# Patient Record
Sex: Female | Born: 1970
Health system: Southern US, Community
[De-identification: ages and names within clinical notes are randomized; demographics above are authoritative.]

## PROBLEM LIST (undated history)

## (undated) DIAGNOSIS — I10 Essential (primary) hypertension: Secondary | ICD-10-CM

## (undated) HISTORY — DX: Essential (primary) hypertension: I10

---

## 2018-01-13 DIAGNOSIS — S9031XA Contusion of right foot, initial encounter: Secondary | ICD-10-CM | POA: Diagnosis not present

## 2018-05-13 ENCOUNTER — Ambulatory Visit: Payer: Self-pay | Admitting: Family Medicine

## 2018-06-28 ENCOUNTER — Ambulatory Visit: Payer: 59 | Admitting: Adult Health

## 2018-06-28 ENCOUNTER — Encounter: Payer: Self-pay | Admitting: Adult Health

## 2018-06-28 VITALS — BP 155/97 | HR 72 | Ht 61.25 in | Wt 157.5 lb

## 2018-06-28 DIAGNOSIS — I10 Essential (primary) hypertension: Secondary | ICD-10-CM

## 2018-06-28 DIAGNOSIS — Z0000 Encounter for general adult medical examination without abnormal findings: Secondary | ICD-10-CM

## 2018-06-28 DIAGNOSIS — N62 Hypertrophy of breast: Secondary | ICD-10-CM

## 2018-06-28 MED ORDER — CLONIDINE HCL 0.2 MG PO TABS
0.2000 mg | ORAL_TABLET | Freq: Once | ORAL | Status: AC
  Administered 2018-06-28: 0.2 mg via ORAL

## 2018-06-28 MED ORDER — AMLODIPINE BESYLATE 5 MG PO TABS: 5.0000 mg | ORAL_TABLET | Freq: Every day | ORAL | 3 refills | Status: DC

## 2018-06-28 NOTE — Assessment & Plan Note (Signed)
Referral to Plastic Surgery placed.

## 2018-06-28 NOTE — Assessment & Plan Note (Addendum)
BP 174/110 Recheck 182/114 Given clonidine 0.2mg  BP recheck 155/97, tolerated med well Initially dx'd with HTN at age 47 Has been of anti-hypertensive >1 year Re-started on Amlodipine 5mg  QD, first dose tomorrow night at bedtime Check BP daily, bring log to f/u in 2 weeks

## 2018-06-28 NOTE — Progress Notes (Signed)
Subjective:    Patient ID: Whitney Graham, female    DOB: April 10, 1971, 47 y.o.   MRN: [id number]  HPI:  Ms. Whitney Graham is here to establish as a new pt.  She is a pleasant 47 year old female. PMH: HTN, Anxiety, Pendulous Breasts that causes cervical neck/thoracic neck pain and poor posture. She was initially dx'd with HTN at age 29, off antihypertensive >1 year She denies acute cardiac sx's She has lost >12 lbs in last 3 weeks with reducing portion size and limiting CHO/sugar intake. She abstains from tobacco use and seldom consumes ETOH She remains active with yard work, "weighted Transport planner", and ellipitical. 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 She has not had regular medical care in years. She also has chronic anxiety that she manages with "being happy"   Patient Care Team    Relationship Specialty Notifications Start End  Julaine Fusi, NP PCP - General Family Medicine  06/28/18     Patient Active Problem List   Diagnosis Date Noted  . Hypertension 06/28/2018  . Large breasts 06/28/2018  . Healthcare maintenance 06/28/2018     Past Medical History:  Diagnosis Date  . Hypertension     Family History  Problem Relation Age of Onset  . Hypertension Father      Social History   Substance and Sexual Activity  Drug Use Never     Social History   Substance and Sexual Activity  Alcohol Use Never  . Frequency: Never     Social History   Tobacco Use  Smoking Status Never Smoker  Smokeless Tobacco Never Used     Outpatient Encounter Medications as of 06/28/2018  Medication Sig  . amLODipine (NORVASC) 5 MG tablet Take 1 tablet (5 mg total) by mouth daily.  . [EXPIRED] cloNIDine (CATAPRES) tablet 0.2 mg    No facility-administered encounter medications on file as of 06/28/2018.     Allergies: Patient has no allergy information on record.  Body mass index is 29.52 kg/m.  Blood pressure (!) 155/97, pulse 72, height 5' 1.25"  (1.556 m), weight 157 lb 8 oz (71.4 kg), last menstrual period 06/24/2018, SpO2 100 %.  Review of Systems  Constitutional: Positive for fatigue. Negative for activity change, appetite change, chills, diaphoresis, fever and unexpected weight change.  HENT: Negative for congestion.   Eyes: Negative for visual disturbance.  Respiratory: Negative for cough, chest tightness, shortness of breath, wheezing and stridor.   Cardiovascular: Negative for chest pain, palpitations and leg swelling.  Gastrointestinal: Negative for abdominal distention, abdominal pain, blood in stool, constipation, diarrhea, nausea and vomiting.  Endocrine: Negative for cold intolerance, heat intolerance, polydipsia, polyphagia and polyuria.  Genitourinary: Negative for difficulty urinating and dysuria.  Musculoskeletal: Positive for arthralgias, back pain, myalgias and neck stiffness.  Skin: Negative for color change, pallor, rash and wound.  Neurological: Negative for dizziness and headaches.  Hematological: Does not bruise/bleed easily.  Psychiatric/Behavioral: Negative for behavioral problems, confusion, decreased concentration, dysphoric mood, hallucinations, self-injury, sleep disturbance and suicidal ideas. The patient is nervous/anxious. The patient is not hyperactive.        Objective:   Physical Exam  Constitutional: She is oriented to person, place, and time. She appears well-developed and well-nourished. No distress.  HENT:  Head: Normocephalic and atraumatic.  Right Ear: External ear normal.  Left Ear: External ear normal.  Nose: Nose normal.  Mouth/Throat: Oropharynx is clear and moist.  Cardiovascular: Normal rate, regular rhythm, normal  heart sounds and intact distal pulses.  No murmur heard. Pulmonary/Chest: Effort normal and breath sounds normal. No stridor. No respiratory distress. She has no wheezes. She has no rales. She exhibits no tenderness.  Musculoskeletal: Normal range of motion. She  exhibits tenderness.       Cervical back: She exhibits tenderness.       Thoracic back: She exhibits tenderness.  Neurological: She is alert and oriented to person, place, and time.  Skin: Skin is warm and dry. Capillary refill takes less than 2 seconds. No rash noted. She is not diaphoretic. No erythema. No pallor.  Psychiatric: She has a normal mood and affect. Her behavior is normal. Judgment and thought content normal.      Assessment & Plan:   1. Healthcare maintenance   2. Large breasts   3. Essential hypertension     Hypertension BP 174/110 Recheck 182/114 Given clonidine 0.2mg  BP recheck 155/97, tolerated med well Initially dx'd with HTN at age 64 Has been of anti-hypertensive >1 year Re-started on Amlodipine 5mg  QD, first dose tomorrow night at bedtime Check BP daily, bring log to f/u in 2 weeks  Large breasts Referral to Plastic Surgery placed  Healthcare maintenance F/u 2 weeks- bring BP log and complete fasting labs Recommend complete physical this fall Mammogram order placed  Pt was in the office today for 45+ minutes, I spent >75% of time in face to face counseling of various medical concerns and in coordination of care  FOLLOW-UP:  Return in about 2 weeks (around 07/12/2018) for Regular Follow Up, HTN, Fasting Labs.

## 2018-06-28 NOTE — Patient Instructions (Addendum)
Mediterranean Diet A Mediterranean diet refers to food and lifestyle choices that are based on the traditions of countries located on the [place] Sea. This way of eating has been shown to help prevent certain conditions and improve outcomes for people who have chronic diseases, like kidney disease and heart disease. What are tips for following this plan? Lifestyle  Cook and eat meals together with your family, when possible.  Drink enough fluid to keep your urine clear or pale yellow.  Be physically active every day. This includes: ? Aerobic exercise like running or swimming. ? Leisure activities like gardening, walking, or housework.  Get 7-8 hours of sleep each night.  If recommended by your health care provider, drink red wine in moderation. This means 1 glass a day for nonpregnant women and 2 glasses a day for men. A glass of wine equals 5 oz (150 mL). Reading food labels  Check the serving size of packaged foods. For foods such as rice and pasta, the serving size refers to the amount of cooked product, not dry.  Check the total fat in packaged foods. Avoid foods that have saturated fat or trans fats.  Check the ingredients list for added sugars, such as corn syrup. Shopping  At the grocery store, buy most of your food from the areas near the walls of the store. This includes: ? Fresh fruits and vegetables (produce). ? Grains, beans, nuts, and seeds. Some of these may be available in unpackaged forms or large amounts (in bulk). ? Fresh seafood. ? Poultry and eggs. ? Low-fat dairy products.  Buy whole ingredients instead of prepackaged foods.  Buy fresh fruits and vegetables in-season from local farmers markets.  Buy frozen fruits and vegetables in resealable bags.  If you do not have access to quality fresh seafood, buy precooked frozen shrimp or canned fish, such as tuna, salmon, or sardines.  Buy small amounts of raw or cooked vegetables, salads, or olives from the  deli or salad bar at your store.  Stock your pantry so you always have certain foods on hand, such as olive oil, canned tuna, canned tomatoes, rice, pasta, and beans. Cooking  Cook foods with extra-virgin olive oil instead of using butter or other vegetable oils.  Have meat as a side dish, and have vegetables or grains as your main dish. This means having meat in small portions or adding small amounts of meat to foods like pasta or stew.  Use beans or vegetables instead of meat in common dishes like chili or lasagna.  Experiment with different cooking methods. Try roasting or broiling vegetables instead of steaming or sauteing them.  Add frozen vegetables to soups, stews, pasta, or rice.  Add nuts or seeds for added healthy fat at each meal. You can add these to yogurt, salads, or vegetable dishes.  Marinate fish or vegetables using olive oil, lemon juice, garlic, and fresh herbs. Meal planning  Plan to eat 1 vegetarian meal one day each week. Try to work up to 2 vegetarian meals, if possible.  Eat seafood 2 or more times a week.  Have healthy snacks readily available, such as: ? Vegetable sticks with hummus. ? Greek yogurt. ? Fruit and nut trail mix.  Eat balanced meals throughout the week. This includes: ? Fruit: 2-3 servings a day ? Vegetables: 4-5 servings a day ? Low-fat dairy: 2 servings a day ? Fish, poultry, or lean meat: 1 serving a day ? Beans and legumes: 2 or more servings a week ? Nuts   and seeds: 1-2 servings a day ? Whole grains: 6-8 servings a day ? Extra-virgin olive oil: 3-4 servings a day  Limit red meat and sweets to only a few servings a month What are my food choices?  Mediterranean diet ? Recommended ? Grains: Whole-grain pasta. Brown rice. Bulgar wheat. Polenta. Couscous. Whole-wheat bread. Orpah Cobb. ? Vegetables: Artichokes. Beets. Broccoli. Cabbage. Carrots. Eggplant. Green beans. Chard. Kale. Spinach. Onions. Leeks. Peas. Squash.  Tomatoes. Peppers. Radishes. ? Fruits: Apples. Apricots. Avocado. Berries. Bananas. Cherries. Dates. Figs. Grapes. Lemons. Melon. Oranges. Peaches. Plums. Pomegranate. ? Meats and other protein foods: Beans. Almonds. Sunflower seeds. Pine nuts. Peanuts. Cod. Salmon. Scallops. Shrimp. Tuna. Tilapia. Clams. Oysters. Eggs. ? Dairy: Low-fat milk. Cheese. Greek yogurt. ? Beverages: Water. Red wine. Herbal tea. ? Fats and oils: Extra virgin olive oil. Avocado oil. Grape seed oil. ? Sweets and desserts: [place] yogurt with honey. Baked apples. Poached pears. Trail mix. ? Seasoning and other foods: Basil. Cilantro. Coriander. Cumin. Mint. Parsley. Sage. Rosemary. Tarragon. Garlic. Oregano. Thyme. Pepper. Balsalmic vinegar. Tahini. Hummus. Tomato sauce. Olives. Mushrooms. ? Limit these ? Grains: Prepackaged pasta or rice dishes. Prepackaged cereal with added sugar. ? Vegetables: Deep fried potatoes (french fries). ? Fruits: Fruit canned in syrup. ? Meats and other protein foods: Beef. Pork. Lamb. Poultry with skin. Hot dogs. Tomasa Blase. ? Dairy: Ice cream. Sour cream. Whole milk. ? Beverages: Juice. Sugar-sweetened soft drinks. Beer. Liquor and spirits. ? Fats and oils: Butter. Canola oil. Vegetable oil. Beef fat (tallow). Lard. ? Sweets and desserts: Cookies. Cakes. Pies. Candy. ? Seasoning and other foods: Mayonnaise. Premade sauces and marinades. ? The items listed may not be a complete list. Talk with your dietitian about what dietary choices are right for you. Summary  The Mediterranean diet includes both food and lifestyle choices.  Eat a variety of fresh fruits and vegetables, beans, nuts, seeds, and whole grains.  Limit the amount of red meat and sweets that you eat.  Talk with your health care provider about whether it is safe for you to drink red wine in moderation. This means 1 glass a day for nonpregnant women and 2 glasses a day for men. A glass of wine equals 5 oz (150 mL). This information  is not intended to replace advice given to you by your health care provider. Make sure you discuss any questions you have with your health care provider. Document Released: 04/24/2016 Document Revised: 05/27/2016 Document Reviewed: 04/24/2016 Elsevier Interactive Patient Education  2018 [employer].  Increase water intake and follow Mediterranean Diet. Due to significantly elevated blood pressure, Clonidine 0.2mg  given in clinic Repeat BP 155/97 Re-started on Amlodipine 5mg , first dose tomorrow night at bedtime (06/29/18). Please check blood pressure daily and bring log to follow-up in two weeks. Also come fasting in 2 weeks. Referral to Plastic Surgery placed, re: Breast Reduction Mammogram order placed. WELCOME TO THE PRACTICE!

## 2018-06-28 NOTE — Assessment & Plan Note (Signed)
F/u 2 weeks- bring BP log and complete fasting labs Recommend complete physical this fall Mammogram order placed

## 2018-07-08 NOTE — Progress Notes (Signed)
Subjective:    Patient ID: Whitney Graham, female    DOB: 05-29-1971, 47 y.o.   MRN: [id number]  HPI: 06/28/18 OV:  Whitney Graham is here to establish as a new pt.  She is a pleasant 47 year old female. PMH: HTN, Anxiety, Pendulous Breasts that causes cervical neck/thoracic neck pain and poor posture. She was initially dx'd with HTN at age 6, off antihypertensive >1 year She denies acute cardiac sx's She has lost >12 lbs in last 3 weeks with reducing portion size and limiting CHO/sugar intake. She abstains from tobacco use and seldom consumes ETOH She remains active with yard work, "weighted Transport planner", and ellipitical. 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 She has not had regular medical care in years. She also has chronic anxiety that she manages with "being happy"  07/12/18 OV: Whitney Graham presents for f/u: HTN She was started on Amlodipine 5mg  QD Home BP  Readings: SBP 130-140 DBP 80-90 HR 70-80 She denies CP/dyspnea/HA/dizziness/Palpitations She has been following a heart healthy diet and unable to exercise due to her daughter being recently ill.  Patient Care Team    Relationship Specialty Notifications Start End  Julaine Fusi, NP PCP - General Family Medicine  06/28/18     Patient Active Problem List   Diagnosis Date Noted  . Hypertension 06/28/2018  . Large breasts 06/28/2018  . Healthcare maintenance 06/28/2018     Past Medical History:  Diagnosis Date  . Hypertension     Family History  Problem Relation Age of Onset  . Hypertension Father      Social History   Substance and Sexual Activity  Drug Use Never     Social History   Substance and Sexual Activity  Alcohol Use Never  . Frequency: Never     Social History   Tobacco Use  Smoking Status Never Smoker  Smokeless Tobacco Never Used     Outpatient Encounter Medications as of 07/12/2018  Medication Sig  . amLODipine (NORVASC) 5 MG tablet Take 1 tablet  (5 mg total) by mouth daily.   No facility-administered encounter medications on file as of 07/12/2018.     Allergies: Patient has no allergy information on record.  Body mass index is 29.09 kg/m.  Blood pressure 140/81, pulse 75, height 5' 1.25" (1.556 m), weight 155 lb 3.2 oz (70.4 kg), last menstrual period 06/24/2018, SpO2 100 %.  Review of Systems  Constitutional: Positive for fatigue. Negative for activity change, appetite change, chills, diaphoresis, fever and unexpected weight change.  HENT: Negative for congestion.   Eyes: Negative for visual disturbance.  Respiratory: Negative for cough, chest tightness, shortness of breath, wheezing and stridor.   Cardiovascular: Negative for chest pain, palpitations and leg swelling.  Gastrointestinal: Negative for abdominal distention, abdominal pain, blood in stool, constipation, diarrhea, nausea and vomiting.  Endocrine: Negative for cold intolerance, heat intolerance, polydipsia, polyphagia and polyuria.  Genitourinary: Negative for difficulty urinating and dysuria.  Musculoskeletal: Positive for arthralgias, back pain, myalgias and neck stiffness.  Skin: Negative for color change, pallor, rash and wound.  Neurological: Negative for dizziness and headaches.  Hematological: Does not bruise/bleed easily.  Psychiatric/Behavioral: Negative for behavioral problems, confusion, decreased concentration, dysphoric mood, hallucinations, self-injury, sleep disturbance and suicidal ideas. The patient is nervous/anxious. The patient is not hyperactive.        Objective:   Physical Exam  Constitutional: She is oriented to person, place, and time. She appears well-developed and well-nourished.  No distress.  HENT:  Head: Normocephalic and atraumatic.  Right Ear: External ear normal.  Left Ear: External ear normal.  Nose: Nose normal.  Mouth/Throat: Oropharynx is clear and moist.  Cardiovascular: Normal rate, regular rhythm, normal heart sounds  and intact distal pulses.  No murmur heard. Pulmonary/Chest: Effort normal and breath sounds normal. No stridor. No respiratory distress. She has no wheezes. She has no rales. She exhibits no tenderness.  Musculoskeletal: Normal range of motion. She exhibits tenderness.       Cervical back: She exhibits tenderness.       Thoracic back: She exhibits tenderness.  Neurological: She is alert and oriented to person, place, and time.  Skin: Skin is warm and dry. Capillary refill takes less than 2 seconds. No rash noted. She is not diaphoretic. No erythema. No pallor.  Psychiatric: She has a normal mood and affect. Her behavior is normal. Judgment and thought content normal.      Assessment & Plan:   1. Essential hypertension   2. Healthcare maintenance     Healthcare maintenance We will call you when lab results are available. Please schedule complete physical in 2 weeks.  Hypertension BP 140/81, HR 75 Continue Amlodipine 50mg  QD Continue Amlodipine 5mg  once daily. Continue to check blood pressure and heart once day. If readings are consistently <100/60 or >140/90 or you develop cardiac symptoms (ie dizziness/chest pain/palpitations) call clinic.   FOLLOW-UP:  Return in about 2 weeks (around 07/26/2018) for CPE.

## 2018-07-12 ENCOUNTER — Encounter: Payer: Self-pay | Admitting: Adult Health

## 2018-07-12 ENCOUNTER — Ambulatory Visit: Payer: 59 | Admitting: Adult Health

## 2018-07-12 VITALS — BP 140/81 | HR 75 | Ht 61.25 in | Wt 155.2 lb

## 2018-07-12 DIAGNOSIS — I10 Essential (primary) hypertension: Secondary | ICD-10-CM | POA: Diagnosis not present

## 2018-07-12 DIAGNOSIS — Z0000 Encounter for general adult medical examination without abnormal findings: Secondary | ICD-10-CM

## 2018-07-12 NOTE — Patient Instructions (Signed)

## 2018-07-12 NOTE — Assessment & Plan Note (Addendum)
We will call you when lab results are available. Please schedule complete physical in 2 weeks.

## 2018-07-12 NOTE — Assessment & Plan Note (Addendum)
BP 140/81, HR 75 Continue Amlodipine 50mg  QD Continue Amlodipine 5mg  once daily. Continue to check blood pressure and heart once day. If readings are consistently <100/60 or >140/90 or you develop cardiac symptoms (ie dizziness/chest pain/palpitations) call clinic.

## 2018-07-13 LAB — LIPID PANEL
CHOLESTEROL TOTAL: 199 mg/dL (ref 100–199)
Chol/HDL Ratio: 3.6 ratio (ref 0.0–4.4)
HDL: 56 mg/dL (ref >39)
LDL Calculated: 126 mg/dL — ABNORMAL HIGH (ref 0–99)
Triglycerides: 83 mg/dL (ref 0–149)
VLDL CHOLESTEROL CAL: 17 mg/dL (ref 5–40)

## 2018-07-13 LAB — CBC WITH DIFFERENTIAL/PLATELET
BASOS: 1 %
Basophils Absolute: 0 10*3/uL (ref 0.0–0.2)
EOS (ABSOLUTE): 0.2 10*3/uL (ref 0.0–0.4)
EOS: 3 %
HEMATOCRIT: 45.7 % (ref 34.0–46.6)
HEMOGLOBIN: 15 g/dL (ref 11.1–15.9)
Immature Grans (Abs): 0 10*3/uL (ref 0.0–0.1)
Immature Granulocytes: 0 %
LYMPHS ABS: 1.7 10*3/uL (ref 0.7–3.1)
Lymphs: 24 %
MCH: 26.4 pg — ABNORMAL LOW (ref 26.6–33.0)
MCHC: 32.8 g/dL (ref 31.5–35.7)
MCV: 81 fL (ref 79–97)
MONOCYTES: 9 %
MONOS ABS: 0.6 10*3/uL (ref 0.1–0.9)
Neutrophils Absolute: 4.4 10*3/uL (ref 1.4–7.0)
Neutrophils: 63 %
Platelets: 275 10*3/uL (ref 150–450)
RBC: 5.68 x10E6/uL — AB (ref 3.77–5.28)
RDW: 13.6 % (ref 12.3–15.4)
WBC: 6.9 10*3/uL (ref 3.4–10.8)

## 2018-07-13 LAB — COMPREHENSIVE METABOLIC PANEL
ALK PHOS: 57 IU/L (ref 39–117)
ALT: 19 IU/L (ref 0–32)
AST: 20 IU/L (ref 0–40)
Albumin/Globulin Ratio: 1.9 (ref 1.2–2.2)
Albumin: 4.5 g/dL (ref 3.5–5.5)
BUN / CREAT RATIO: 14 (ref 9–23)
BUN: 12 mg/dL (ref 6–24)
Bilirubin Total: 0.5 mg/dL (ref 0.0–1.2)
CALCIUM: 9.7 mg/dL (ref 8.7–10.2)
CO2: 23 mmol/L (ref 20–29)
CREATININE: 0.85 mg/dL (ref 0.57–1.00)
Chloride: 103 mmol/L (ref 96–106)
GFR, EST AFRICAN AMERICAN: 96 mL/min/{1.73_m2} (ref >59)
GFR, EST NON AFRICAN AMERICAN: 84 mL/min/{1.73_m2} (ref >59)
GLOBULIN, TOTAL: 2.4 g/dL (ref 1.5–4.5)
Glucose: 94 mg/dL (ref 65–99)
Potassium: 5 mmol/L (ref 3.5–5.2)
SODIUM: 139 mmol/L (ref 134–144)
Total Protein: 6.9 g/dL (ref 6.0–8.5)

## 2018-07-13 LAB — HEMOGLOBIN A1C
ESTIMATED AVERAGE GLUCOSE: 114 mg/dL
HEMOGLOBIN A1C: 5.6 % (ref 4.8–5.6)

## 2018-07-13 LAB — TSH: TSH: 2.46 u[IU]/mL (ref 0.450–4.500)

## 2018-07-14 ENCOUNTER — Telehealth: Payer: Self-pay | Admitting: Adult Health

## 2018-07-14 NOTE — Telephone Encounter (Signed)
Per patient received a call from [facility] regarding Lab results.  --Forward message call returned.

## 2018-07-14 NOTE — Telephone Encounter (Signed)
See documentation on result notes.  T. Kenetha Cozza, CMA 

## 2018-07-16 ENCOUNTER — Encounter: Payer: Self-pay | Admitting: Adult Health

## 2018-07-26 ENCOUNTER — Encounter: Payer: 59 | Admitting: Adult Health

## 2018-08-16 ENCOUNTER — Ambulatory Visit: Payer: 59

## 2018-08-17 ENCOUNTER — Ambulatory Visit: Payer: 59

## 2018-08-24 ENCOUNTER — Encounter: Payer: 59 | Admitting: Adult Health

## 2018-08-24 NOTE — Progress Notes (Deleted)
Subjective:    Patient ID: Whitney Graham, female    DOB: August 14, 1971, 47 y.o.   MRN: [id number]  HPI: 06/28/18 OV:  Whitney Graham is here to establish as a new pt.  She is a pleasant 47 year old female. PMH: HTN, Anxiety, Pendulous Breasts that causes cervical neck/thoracic neck pain and poor posture. She was initially dx'd with HTN at age 46, off antihypertensive >1 year She denies acute cardiac sx's She has lost >12 lbs in last 3 weeks with reducing portion size and limiting CHO/sugar intake. She abstains from tobacco use and seldom consumes ETOH She remains active with yard work, "weighted Transport planner", and ellipitical. 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 She has not had regular medical care in years. She also has chronic anxiety that she manages with "being happy"  07/12/18 OV: Whitney Graham presents for f/u: HTN She was started on Amlodipine 5mg  QD Home BP  Readings: SBP 130-140 DBP 80-90 HR 70-80 She denies CP/dyspnea/HA/dizziness/Palpitations She has been following a heart healthy diet and unable to exercise due to her daughter being recently ill.  08/24/18 OV: Whitney Graham is here for CPE  Healthcare Maintenance: PAP- Mammogram- Immunizations-  Patient Care Team    Relationship Specialty Notifications Start End  Julaine Fusi, NP PCP - General Family Medicine  06/28/18     Patient Active Problem List   Diagnosis Date Noted  . Hypertension 06/28/2018  . Large breasts 06/28/2018  . Healthcare maintenance 06/28/2018     Past Medical History:  Diagnosis Date  . Hypertension     Family History  Problem Relation Age of Onset  . Hypertension Father      Social History   Substance and Sexual Activity  Drug Use Never     Social History   Substance and Sexual Activity  Alcohol Use Never  . Frequency: Never     Social History   Tobacco Use  Smoking Status Never Smoker  Smokeless Tobacco Never Used     Outpatient  Encounter Medications as of 08/24/2018  Medication Sig  . amLODipine (NORVASC) 5 MG tablet Take 1 tablet (5 mg total) by mouth daily.   No facility-administered encounter medications on file as of 08/24/2018.     Allergies: Patient has no allergy information on record.  There is no height or weight on file to calculate BMI.  There were no vitals taken for this visit.  Review of Systems  Constitutional: Positive for fatigue. Negative for activity change, appetite change, chills, diaphoresis, fever and unexpected weight change.  HENT: Negative for congestion.   Eyes: Negative for visual disturbance.  Respiratory: Negative for cough, chest tightness, shortness of breath, wheezing and stridor.   Cardiovascular: Negative for chest pain, palpitations and leg swelling.  Gastrointestinal: Negative for abdominal distention, abdominal pain, blood in stool, constipation, diarrhea, nausea and vomiting.  Endocrine: Negative for cold intolerance, heat intolerance, polydipsia, polyphagia and polyuria.  Genitourinary: Negative for difficulty urinating and dysuria.  Musculoskeletal: Positive for arthralgias, back pain, myalgias and neck stiffness.  Skin: Negative for color change, pallor, rash and wound.  Neurological: Negative for dizziness and headaches.  Hematological: Does not bruise/bleed easily.  Psychiatric/Behavioral: Negative for behavioral problems, confusion, decreased concentration, dysphoric mood, hallucinations, self-injury, sleep disturbance and suicidal ideas. The patient is nervous/anxious. The patient is not hyperactive.        Objective:   Physical Exam  Constitutional: She is oriented to person, place, and time. She  appears well-developed and well-nourished. No distress.  HENT:  Head: Normocephalic and atraumatic.  Right Ear: External ear normal.  Left Ear: External ear normal.  Nose: Nose normal.  Mouth/Throat: Oropharynx is clear and moist.  Cardiovascular: Normal rate,  regular rhythm, normal heart sounds and intact distal pulses.  No murmur heard. Pulmonary/Chest: Effort normal and breath sounds normal. No stridor. No respiratory distress. She has no wheezes. She has no rales. She exhibits no tenderness.  Musculoskeletal: Normal range of motion. She exhibits tenderness.       Cervical back: She exhibits tenderness.       Thoracic back: She exhibits tenderness.  Neurological: She is alert and oriented to person, place, and time.  Skin: Skin is warm and dry. Capillary refill takes less than 2 seconds. No rash noted. She is not diaphoretic. No erythema. No pallor.  Psychiatric: She has a normal mood and affect. Her behavior is normal. Judgment and thought content normal.      Assessment & Plan:   No diagnosis found.  No problem-specific Assessment & Plan notes found for this encounter.  FOLLOW-UP:  No follow-ups on file.

## 2018-09-22 ENCOUNTER — Ambulatory Visit
Admission: RE | Admit: 2018-09-22 | Discharge: 2018-09-22 | Disposition: A | Discharge status: Home / Self Care | Payer: 59 | Source: Ambulatory Visit | Attending: Adult Health | Admitting: Adult Health

## 2018-09-22 DIAGNOSIS — Z0000 Encounter for general adult medical examination without abnormal findings: Secondary | ICD-10-CM

## 2018-09-22 DIAGNOSIS — Z1231 Encounter for screening mammogram for malignant neoplasm of breast: Secondary | ICD-10-CM | POA: Diagnosis not present

## 2018-09-28 ENCOUNTER — Encounter: Payer: 59 | Admitting: Adult Health

## 2019-07-18 ENCOUNTER — Other Ambulatory Visit: Payer: Self-pay | Admitting: Adult Health

## 2019-07-18 ENCOUNTER — Telehealth: Payer: Self-pay

## 2019-07-18 NOTE — Telephone Encounter (Signed)
Please call pt to schedule OV.  No further refills on meds until pt is seen.  Charyl Bigger, CMA

## 2019-07-23 ENCOUNTER — Encounter: Payer: Self-pay | Admitting: Adult Health

## 2019-07-25 ENCOUNTER — Other Ambulatory Visit: Payer: Self-pay

## 2019-08-14 ENCOUNTER — Other Ambulatory Visit: Payer: Self-pay | Admitting: Adult Health

## 2019-08-17 NOTE — Progress Notes (Signed)
Subjective:    Patient ID: Whitney Graham, female    DOB: 1971-08-28, 48 y.o.   MRN: [id number]  HPI:  06/28/18 OV:  [name] is here to establish as a new pt.  She is a pleasant 48 year old female. PMH: HTN, Anxiety, Pendulous Breasts that causes cervical neck/thoracic neck pain and poor posture. She was initially dx'd with HTN at age 23, off antihypertensive >1 year She denies acute cardiac sx's She has lost >12 lbs in last 3 weeks with reducing portion size and limiting CHO/sugar intake. She abstains from tobacco use and seldom consumes ETOH She remains active with yard work, "weighted Transport planner", and ellipitical. 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 She has not had regular medical care in years. She also has chronic anxiety that she manages with "being happy"  07/12/18 OV: [name] presents for f/u: HTN She was started on Amlodipine 5mg  QD  Home BP  Readings: SBP 130-140 DBP 80-90 HR 70-80 She denies CP/dyspnea/HA/dizziness/Palpitations She has been following a heart healthy diet and unable to exercise due to her daughter being recently ill.  08/18/2019 OV: [name] is here for f/u: HTN She was started on Amlodipine 5mg  06/2018- instructed to f/u 2 weeks This is her first OV since then. Per pt her ambulatory readings were reading high SBP 130-140s DBP upper 90s So she started taking her husband's Amlodipine 10mg  QD-  Diastolic readings reduced <80  She denies CP/dyspnea/HA/dizziness/Palpitations She has been trying to follow heart healthy diet, denies regular exercise.  She continues to abstain from tobacco/vape/excessive ETOH use  Fasting labs obtained today  Patient Care Team    Relationship Specialty Notifications Start End  , NP PCP - General Family Medicine  06/28/18     Patient Active Problem List   Diagnosis Date Noted  . Hypertension 06/28/2018  . Large breasts 06/28/2018  . Healthcare  maintenance 06/28/2018     Past Medical History:  Diagnosis Date  . Hypertension      Past Surgical History:  Procedure Laterality Date  . CESAREAN SECTION       Family History  Problem Relation Age of Onset  . Hypertension Father   . Breast cancer Maternal Grandmother   . Breast cancer Paternal Grandmother      Social History   Substance and Sexual Activity  Drug Use Never     Social History   Substance and Sexual Activity  Alcohol Use Never  . Frequency: Never     Social History   Tobacco Use  Smoking Status Never Smoker  Smokeless Tobacco Never Used     Outpatient Encounter Medications as of 08/18/2019  Medication Sig  . amLODipine (NORVASC) 10 MG tablet Take 1 tablet (10 mg total) by mouth daily. MUST HAVE OFFICE VISIT PRIOR TO ANY FURTHER REFILLS  . [DISCONTINUED] amLODipine (NORVASC) 5 MG tablet TAKE 1 TABLET (5 MG TOTAL) BY MOUTH DAILY. MUST HAVE OFFICE VISIT PRIOR TO ANY FURTHER REFILLS (Patient taking differently: Take 10 mg by mouth daily. MUST HAVE OFFICE VISIT PRIOR TO ANY FURTHER REFILLS)   No facility-administered encounter medications on file as of 08/18/2019.     Allergies: Patient has no allergy information on record.  Body mass index is 29.1 kg/m.  Blood pressure 136/89, pulse 94, temperature 98.8 F (37.1 C), temperature source Oral, height 5' 1.25" (1.556 m), weight 155 lb 4.8 oz (70.4 kg), last menstrual period 08/18/2019, SpO2 100 %.  Review of Systems  Constitutional: Positive for fatigue. Negative for activity change, appetite change, chills, diaphoresis, fever and unexpected weight change.  HENT: Negative for congestion.   Eyes: Negative for visual disturbance.  Respiratory: Negative for cough, chest tightness, shortness of breath, wheezing and stridor.   Cardiovascular: Negative for chest pain, palpitations and leg swelling.  Gastrointestinal: Negative for abdominal distention, abdominal pain, blood in stool,  constipation, diarrhea, nausea and vomiting.  Endocrine: Negative for polydipsia, polyphagia and polyuria.  Genitourinary: Negative for difficulty urinating and flank pain.  Musculoskeletal: Negative for arthralgias and gait problem.  Neurological: Negative for dizziness and headaches.  Hematological: Negative for adenopathy. Does not bruise/bleed easily.  Psychiatric/Behavioral: Negative for agitation, behavioral problems, confusion, decreased concentration, dysphoric mood, hallucinations, self-injury, sleep disturbance and suicidal ideas. The patient is not nervous/anxious and is not hyperactive.        Objective:   Physical Exam Vitals signs and nursing note reviewed.  Constitutional:      General: She is not in acute distress.    Appearance: Normal appearance. She is not ill-appearing, toxic-appearing or diaphoretic.  HENT:     Head: Normocephalic and atraumatic.  Cardiovascular:     Rate and Rhythm: Normal rate and regular rhythm.     Pulses: Normal pulses.     Heart sounds: Normal heart sounds. No murmur. No friction rub. No gallop.   Skin:    General: Skin is warm and dry.     Capillary Refill: Capillary refill takes less than 2 seconds.  Neurological:     Mental Status: She is alert and oriented to person, place, and time.  Psychiatric:        Mood and Affect: Mood normal.        Behavior: Behavior normal.        Thought Content: Thought content normal.        Judgment: Judgment normal.           Assessment & Plan:   1. Hypertension, unspecified type   2. Healthcare maintenance   3. Carpal tunnel syndrome of left wrist     Healthcare maintenance Amlodipine 10mg  once daily. Please check your blood pressure/heart rate once daily-record. We will contact you with your lab results. Follow-up in 2 weeks, TeleMedicine  Continue to social distance and wear a mask when in public.  Hypertension Per pt her ambulatory readings were reading high SBP 130-140s DBP  upper 90s So she started taking her husband's Amlodipine 10mg  QD-  Diastolic readings reduced <80 CCB refilled for 10mg  strength Check BP/HR daily- f/u 2 weeks via TeleMedicine     FOLLOW-UP:  Return in about 2 weeks (around 09/01/2019) for HTN, Evaluate Medication Effectiveness, TeleMedicine Follow-Up.

## 2019-08-18 ENCOUNTER — Ambulatory Visit (INDEPENDENT_AMBULATORY_CARE_PROVIDER_SITE_OTHER): Payer: 59 | Admitting: Adult Health

## 2019-08-18 ENCOUNTER — Encounter: Payer: Self-pay | Admitting: Adult Health

## 2019-08-18 ENCOUNTER — Other Ambulatory Visit: Payer: Self-pay

## 2019-08-18 VITALS — BP 136/89 | HR 94 | Temp 98.8°F | Ht 61.25 in | Wt 155.3 lb

## 2019-08-18 DIAGNOSIS — I10 Essential (primary) hypertension: Secondary | ICD-10-CM

## 2019-08-18 DIAGNOSIS — Z0000 Encounter for general adult medical examination without abnormal findings: Secondary | ICD-10-CM

## 2019-08-18 DIAGNOSIS — G5602 Carpal tunnel syndrome, left upper limb: Secondary | ICD-10-CM

## 2019-08-18 MED ORDER — AMLODIPINE BESYLATE 10 MG PO TABS: 10.0000 mg | ORAL_TABLET | Freq: Every day | ORAL | 0 refills | Status: DC

## 2019-08-18 NOTE — Patient Instructions (Addendum)
Managing Your Hypertension Hypertension is commonly called high blood pressure. This is when the force of your blood pressing against the walls of your arteries is too strong. Arteries are blood vessels that carry blood from your heart throughout your body. Hypertension forces the heart to work harder to pump blood, and may cause the arteries to become narrow or stiff. Having untreated or uncontrolled hypertension can cause heart attack, stroke, kidney disease, and other problems. What are blood pressure readings? A blood pressure reading consists of a higher number over a lower number. Ideally, your blood pressure should be below 120/80. The first ("top") number is called the systolic pressure. It is a measure of the pressure in your arteries as your heart beats. The second ("bottom") number is called the diastolic pressure. It is a measure of the pressure in your arteries as the heart relaxes. What does my blood pressure reading mean? Blood pressure is classified into four stages. Based on your blood pressure reading, your health care provider may use the following stages to determine what type of treatment you need, if any. Systolic pressure and diastolic pressure are measured in a unit called mm Hg. Normal  Systolic pressure: below 784.  Diastolic pressure: below 80. Elevated  Systolic pressure: 696-295.  Diastolic pressure: below 80. Hypertension stage 1  Systolic pressure: 284-132.  Diastolic pressure: 44-01. Hypertension stage 2  Systolic pressure: 027 or above.  Diastolic pressure: 90 or above. What health risks are associated with hypertension? Managing your hypertension is an important responsibility. Uncontrolled hypertension can lead to:  A heart attack.  A stroke.  A weakened blood vessel (aneurysm).  Heart failure.  Kidney damage.  Eye damage.  Metabolic syndrome.  Memory and concentration problems. What changes can I make to manage my hypertension?  Hypertension can be managed by making lifestyle changes and possibly by taking medicines. Your health care provider will help you make a plan to bring your blood pressure within a normal range. Eating and drinking   Eat a diet that is high in fiber and potassium, and low in salt (sodium), added sugar, and fat. An example eating plan is called the DASH (Dietary Approaches to Stop Hypertension) diet. To eat this way: ? Eat plenty of fresh fruits and vegetables. Try to fill half of your plate at each meal with fruits and vegetables. ? Eat whole grains, such as whole wheat pasta, brown rice, or whole grain bread. Fill about one quarter of your plate with whole grains. ? Eat low-fat diary products. ? Avoid fatty cuts of meat, processed or cured meats, and poultry with skin. Fill about one quarter of your plate with lean proteins such as fish, chicken without skin, beans, eggs, and tofu. ? Avoid premade and processed foods. These tend to be higher in sodium, added sugar, and fat.  Reduce your daily sodium intake. Most people with hypertension should eat less than 1,500 mg of sodium a day.  Limit alcohol intake to no more than 1 drink a day for nonpregnant women and 2 drinks a day for men. One drink equals 12 oz of beer, 5 oz of wine, or 1 oz of hard liquor. Lifestyle  Work with your health care provider to maintain a healthy body weight, or to lose weight. Ask what an ideal weight is for you.  Get at least 30 minutes of exercise that causes your heart to beat faster (aerobic exercise) most days of the week. Activities may include walking, swimming, or biking.  Include exercise  to strengthen your muscles (resistance exercise), such as weight lifting, as part of your weekly exercise routine. Try to do these types of exercises for 30 minutes at least 3 days a week.  Do not use any products that contain nicotine or tobacco, such as cigarettes and e-cigarettes. If you need help quitting, ask your health  care provider.  Control any long-term (chronic) conditions you have, such as high cholesterol or diabetes. Monitoring  Monitor your blood pressure at home as told by your health care provider. Your personal target blood pressure may vary depending on your medical conditions, your age, and other factors.  Have your blood pressure checked regularly, as often as told by your health care provider. Working with your health care provider  Review all the medicines you take with your health care provider because there may be side effects or interactions.  Talk with your health care provider about your diet, exercise habits, and other lifestyle factors that may be contributing to hypertension.  Visit your health care provider regularly. Your health care provider can help you create and adjust your plan for managing hypertension. Will I need medicine to control my blood pressure? Your health care provider may prescribe medicine if lifestyle changes are not enough to get your blood pressure under control, and if:  Your systolic blood pressure is 130 or higher.  Your diastolic blood pressure is 80 or higher. Take medicines only as told by your health care provider. Follow the directions carefully. Blood pressure medicines must be taken as prescribed. The medicine does not work as well when you skip doses. Skipping doses also puts you at risk for problems. Contact a health care provider if:  You think you are having a reaction to medicines you have taken.  You have repeated (recurrent) headaches.  You feel dizzy.  You have swelling in your ankles.  You have trouble with your vision. Get help right away if:  You develop a severe headache or confusion.  You have unusual weakness or numbness, or you feel faint.  You have severe pain in your chest or abdomen.  You vomit repeatedly.  You have trouble breathing. Summary  Hypertension is when the force of blood pumping through your arteries  is too strong. If this condition is not controlled, it may put you at risk for serious complications.  Your personal target blood pressure may vary depending on your medical conditions, your age, and other factors. For most people, a normal blood pressure is less than 120/80.  Hypertension is managed by lifestyle changes, medicines, or both. Lifestyle changes include weight loss, eating a healthy, low-sodium diet, exercising more, and limiting alcohol. This information is not intended to replace advice given to you by your health care provider. Make sure you discuss any questions you have with your health care provider. Document Released: 05/26/2012 Document Revised: 12/24/2018 Document Reviewed: 07/30/2016 Elsevier Patient Education  [place].  Amlodipine 10mg  once daily. Please check your blood pressure/heart rate once daily-record. We will contact you with your lab results. Follow-up in 2 weeks, TeleMedicine  Continue to social distance and wear a mask when in public. GREAT TO SEE YOU!

## 2019-08-18 NOTE — Addendum Note (Signed)
Addended by: Fonnie Mu on: 08/18/2019 11:21 AM   Modules accepted: Orders

## 2019-08-18 NOTE — Assessment & Plan Note (Signed)
Per pt her ambulatory readings were reading high SBP 130-140s DBP upper 90s So she started taking her husband's Amlodipine 10mg  QD-  Diastolic readings reduced <80 CCB refilled for 10mg  strength Check BP/HR daily- f/u 2 weeks via TeleMedicine

## 2019-08-18 NOTE — Assessment & Plan Note (Signed)
Amlodipine 10mg  once daily. Please check your blood pressure/heart rate once daily-record. We will contact you with your lab results. Follow-up in 2 weeks, TeleMedicine  Continue to social distance and wear a mask when in public.

## 2019-08-19 ENCOUNTER — Encounter: Payer: Self-pay | Admitting: Adult Health

## 2019-08-19 LAB — CBC WITH DIFFERENTIAL/PLATELET
Basophils Absolute: 0 10*3/uL (ref 0.0–0.2)
Basos: 0 %
EOS (ABSOLUTE): 0.2 10*3/uL (ref 0.0–0.4)
Eos: 3 %
Hematocrit: 43.5 % (ref 34.0–46.6)
Hemoglobin: 13.9 g/dL (ref 11.1–15.9)
Immature Grans (Abs): 0 10*3/uL (ref 0.0–0.1)
Immature Granulocytes: 0 %
Lymphocytes Absolute: 1.9 10*3/uL (ref 0.7–3.1)
Lymphs: 26 %
MCH: 25.7 pg — ABNORMAL LOW (ref 26.6–33.0)
MCHC: 32 g/dL (ref 31.5–35.7)
MCV: 80 fL (ref 79–97)
Monocytes Absolute: 0.6 10*3/uL (ref 0.1–0.9)
Monocytes: 8 %
Neutrophils Absolute: 4.5 10*3/uL (ref 1.4–7.0)
Neutrophils: 63 %
Platelets: 300 10*3/uL (ref 150–450)
RBC: 5.41 x10E6/uL — ABNORMAL HIGH (ref 3.77–5.28)
RDW: 13.6 % (ref 11.7–15.4)
WBC: 7.3 10*3/uL (ref 3.4–10.8)

## 2019-08-19 LAB — LIPID PANEL
Chol/HDL Ratio: 2.7 ratio (ref 0.0–4.4)
Cholesterol, Total: 210 mg/dL — ABNORMAL HIGH (ref 100–199)
HDL: 77 mg/dL (ref >39)
LDL Chol Calc (NIH): 123 mg/dL — ABNORMAL HIGH (ref 0–99)
Triglycerides: 57 mg/dL (ref 0–149)
VLDL Cholesterol Cal: 10 mg/dL (ref 5–40)

## 2019-08-19 LAB — COMPREHENSIVE METABOLIC PANEL
ALT: 15 IU/L (ref 0–32)
AST: 19 IU/L (ref 0–40)
Albumin/Globulin Ratio: 1.7 (ref 1.2–2.2)
Albumin: 4.5 g/dL (ref 3.8–4.8)
Alkaline Phosphatase: 71 IU/L (ref 39–117)
BUN/Creatinine Ratio: 16 (ref 9–23)
BUN: 12 mg/dL (ref 6–24)
Bilirubin Total: 0.4 mg/dL (ref 0.0–1.2)
CO2: 24 mmol/L (ref 20–29)
Calcium: 8.9 mg/dL (ref 8.7–10.2)
Chloride: 105 mmol/L (ref 96–106)
Creatinine, Ser: 0.74 mg/dL (ref 0.57–1.00)
GFR calc Af Amer: 113 mL/min/{1.73_m2} (ref >59)
GFR calc non Af Amer: 98 mL/min/{1.73_m2} (ref >59)
Globulin, Total: 2.7 g/dL (ref 1.5–4.5)
Glucose: 94 mg/dL (ref 65–99)
Potassium: 4.3 mmol/L (ref 3.5–5.2)
Sodium: 141 mmol/L (ref 134–144)
Total Protein: 7.2 g/dL (ref 6.0–8.5)

## 2019-08-19 LAB — HEMOGLOBIN A1C
Est. average glucose Bld gHb Est-mCnc: 114 mg/dL
Hgb A1c MFr Bld: 5.6 % (ref 4.8–5.6)

## 2019-08-19 LAB — TSH: TSH: 1.48 u[IU]/mL (ref 0.450–4.500)

## 2019-08-31 NOTE — Progress Notes (Signed)
Virtual Visit via Telephone Note  I connected with Whitney Graham on 09/01/2019 at 10:30 AM EST by telephone and verified that I am speaking with the correct person using two identifiers.  Location: Patient: Home Provider: In Clinic   I discussed the limitations, risks, security and privacy concerns of performing an evaluation and management service by telephone and the availability of in person appointments. I also discussed with the patient that there may be a patient responsible charge related to this service. The patient expressed understanding and agreed to proceed.   History of Present Illness:  07/12/18 OV: Whitney Graham presents for f/u: HTN She was started on Amlodipine 5mg  QD  Home BP Readings: SBP130-140 DBP80-90 HR70-80 She denies CP/dyspnea/HA/dizziness/Palpitations She has been following a heart healthy diet and unable to exercise due to her daughter being recently ill.  08/18/2019 OV: Whitney Graham is here for f/u: HTN She was started on Amlodipine 5mg  06/2018- instructed to f/u 2 weeks This is her first OV since then. Per pt her ambulatory readings were reading high SBP 130-140s DBP upper 90s So she started taking her husband's Amlodipine 10mg  QD-  Diastolic readings reduced <80  She denies CP/dyspnea/HA/dizziness/Palpitations She has been trying to follow heart healthy diet, denies regular exercise.  She continues to abstain from tobacco/vape/excessive ETOH use  Fasting labs obtained today   09/01/2019 OV: Whitney Graham calls in for 2 week f/u: HTN Amlodipine was increased to 10mg  QD. She reports ambulatory readings- SBP 120-130 DBP 80 She denies CP/chest tightness with exertion. She denies HA/dizziness/palpation. She remains quite active all day. She reports high intake of butter on daily basis, recent lipid panel Total-210 HDL-77-superb LDL-123 She continues to struggle with water intake, estimates to drink 16-20 oz/day.   Patient  Care Team    Relationship Specialty Notifications Start End  Esaw Grandchild, NP PCP - General Family Medicine  06/28/18     Patient Active Problem List   Diagnosis Date Noted  . Hypertension 06/28/2018  . Large breasts 06/28/2018  . Healthcare maintenance 06/28/2018     Past Medical History:  Diagnosis Date  . Hypertension      Past Surgical History:  Procedure Laterality Date  . CESAREAN SECTION       Family History  Problem Relation Age of Onset  . Hypertension Father   . Breast cancer Maternal Grandmother   . Breast cancer Paternal Grandmother      Social History   Substance and Sexual Activity  Drug Use Never     Social History   Substance and Sexual Activity  Alcohol Use Never     Social History   Tobacco Use  Smoking Status Never Smoker  Smokeless Tobacco Never Used     Outpatient Encounter Medications as of 09/01/2019  Medication Sig  . amLODipine (NORVASC) 10 MG tablet Take 1 tablet (10 mg total) by mouth daily. MUST HAVE OFFICE VISIT PRIOR TO ANY FURTHER REFILLS   No facility-administered encounter medications on file as of 09/01/2019.    Allergies: Patient has no allergy information on record.  There is no height or weight on file to calculate BMI.  Last menstrual period 08/18/2019. Review of Systems: General:   Denies fever, chills, unexplained weight loss.  Optho/Auditory:   Denies visual changes, blurred vision/LOV Respiratory:   Denies SOB, DOE more than baseline levels.  Cardiovascular:   Denies chest pain, palpitations, new onset peripheral edema  Gastrointestinal:   Denies nausea, vomiting, diarrhea.  Genitourinary: Denies dysuria, freq/ urgency,  flank pain or discharge from genitals.  Endocrine:     Denies hot or cold intolerance, polyuria, polydipsia. Musculoskeletal:   Denies unexplained myalgias, joint swelling, unexplained arthralgias, gait problems.  Skin:  Denies rash, suspicious lesions Neurological:     Denies  dizziness, unexplained weakness, numbness  Psychiatric/Behavioral:   Denies mood changes, suicidal or homicidal ideations, hallucinations Observations/Objective: No acute distress noted during telephone conversation.  Assessment and Plan: Continue all medications as directed. BP at goal with increased CCB dose. Increase water intake, strive for at least 80 ounces/day.   Follow Heart Healthy diet Reduce saturated fat,continue active lifestyle. Continue to social distance and wear a mask when in public.  Follow Up Instructions: 3 months OV with lipid panel   I discussed the assessment and treatment plan with the patient. The patient was provided an opportunity to ask questions and all were answered. The patient agreed with the plan and demonstrated an understanding of the instructions.   The patient was advised to call back or seek an in-person evaluation if the symptoms worsen or if the condition fails to improve as anticipated.  I provided 8 minutes of non-face-to-face time during this encounter.   Julaine Fusi, NP

## 2019-09-01 ENCOUNTER — Encounter: Payer: Self-pay | Admitting: Adult Health

## 2019-09-01 ENCOUNTER — Ambulatory Visit (INDEPENDENT_AMBULATORY_CARE_PROVIDER_SITE_OTHER): Payer: 59 | Admitting: Adult Health

## 2019-09-01 ENCOUNTER — Other Ambulatory Visit: Payer: Self-pay

## 2019-09-01 VITALS — BP 129/84 | Temp 98.5°F | Ht 61.25 in | Wt 154.0 lb

## 2019-09-01 DIAGNOSIS — I10 Essential (primary) hypertension: Secondary | ICD-10-CM

## 2019-09-01 DIAGNOSIS — E7800 Pure hypercholesterolemia, unspecified: Secondary | ICD-10-CM

## 2019-09-01 NOTE — Assessment & Plan Note (Signed)
Assessment and Plan: Continue all medications as directed. BP at goal with increased CCB dose. Increase water intake, strive for at least 80 ounces/day.   Follow Heart Healthy diet Reduce saturated fat,continue active lifestyle. Continue to social distance and wear a mask when in public.  Follow Up Instructions: 3 months OV with lipid panel   I discussed the assessment and treatment plan with the patient. The patient was provided an opportunity to ask questions and all were answered. The patient agreed with the plan and demonstrated an understanding of the instructions.   The patient was advised to call back or seek an in-person evaluation if the symptoms worsen or if the condition fails to improve as anticipated.

## 2019-09-12 ENCOUNTER — Ambulatory Visit: Payer: 59 | Admitting: Adult Health

## 2019-11-15 ENCOUNTER — Other Ambulatory Visit: Payer: Self-pay | Admitting: Adult Health

## 2019-11-15 ENCOUNTER — Telehealth: Payer: Self-pay | Admitting: Adult Health

## 2019-11-15 ENCOUNTER — Telehealth: Payer: Self-pay

## 2019-11-15 NOTE — Telephone Encounter (Signed)
Pt called stating that she did not believe that she should have to have an office visit for further refills of her blood pressure medication.  Pt stated that her follow up in March was supposed to be a follow up for her triglycerides, but states that she has not been modifying her diet and therefore she should not have to come in for recheck.  Advised pt that we also typically monitor hypertension every 3 months and since William Hamburger has left our office, Dr. Sharee Holster would need to see her since she is not familiar with her before she will authorize any further refills.  Pt inquired about obtaining records if she wishes to transfer her care.  Information given.  Tiajuana Amass, CMA

## 2019-11-15 NOTE — Telephone Encounter (Signed)
See additional phone note from today.  T. Mekhai Venuto, CMA 

## 2019-11-15 NOTE — Telephone Encounter (Signed)
Patient returned med asst's call.  --glh

## 2019-11-30 ENCOUNTER — Other Ambulatory Visit: Payer: Self-pay | Admitting: Family Medicine

## 2019-12-01 ENCOUNTER — Other Ambulatory Visit: Payer: 59

## 2019-12-04 IMAGING — MG DIGITAL SCREENING BILATERAL MAMMOGRAM WITH TOMO AND CAD
8 series · 8 of 24 positions shown · non-contrast
Comparison: None.

CLINICAL DATA: Screening.

EXAM:
DIGITAL SCREENING BILATERAL MAMMOGRAM WITH TOMO AND CAD

[R CC synth-2D]
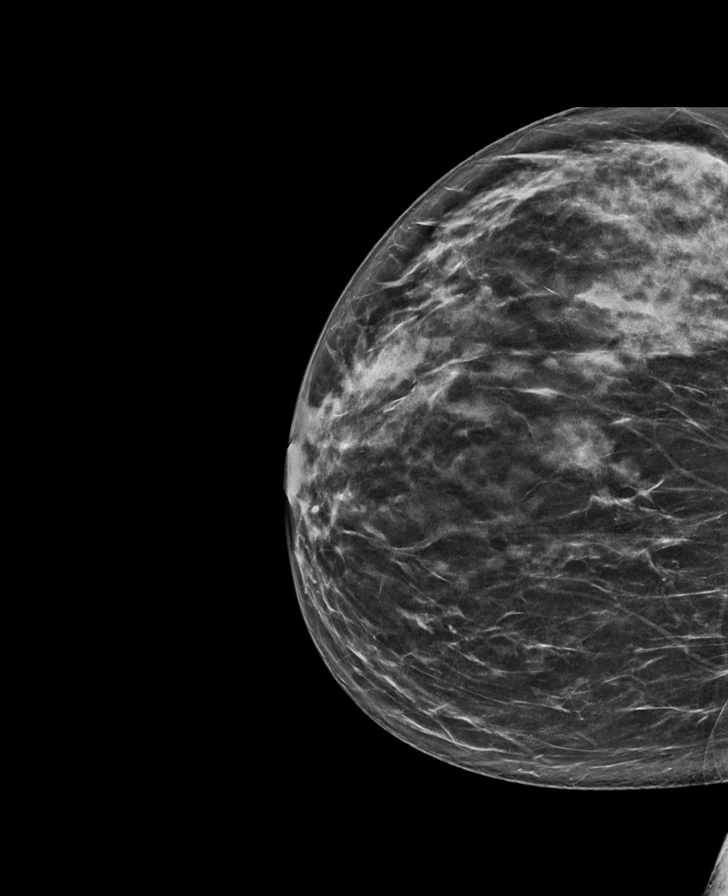

[L CC synth-2D]
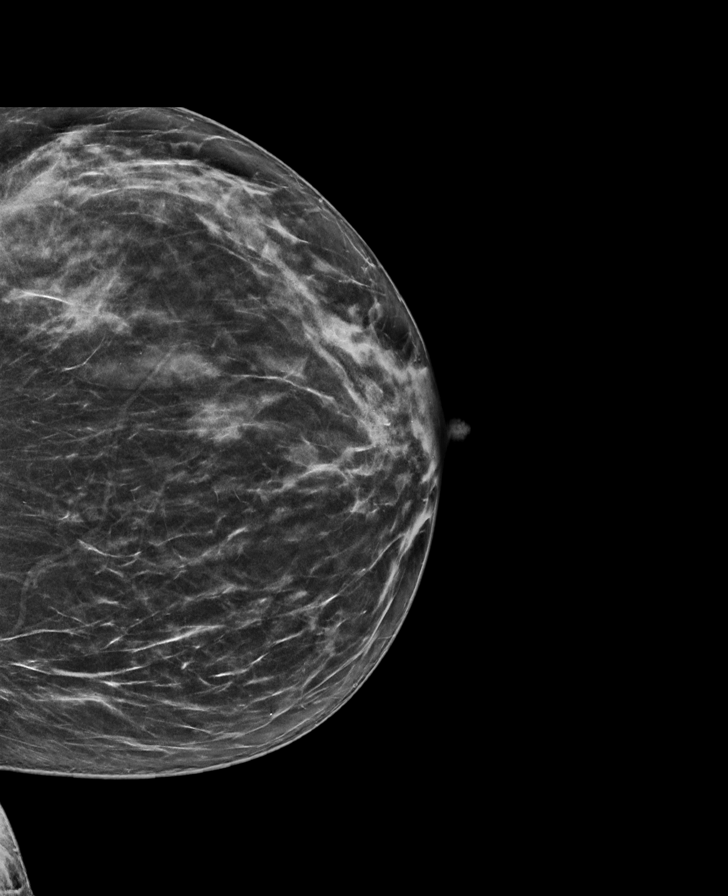

[R MLO synth-2D]
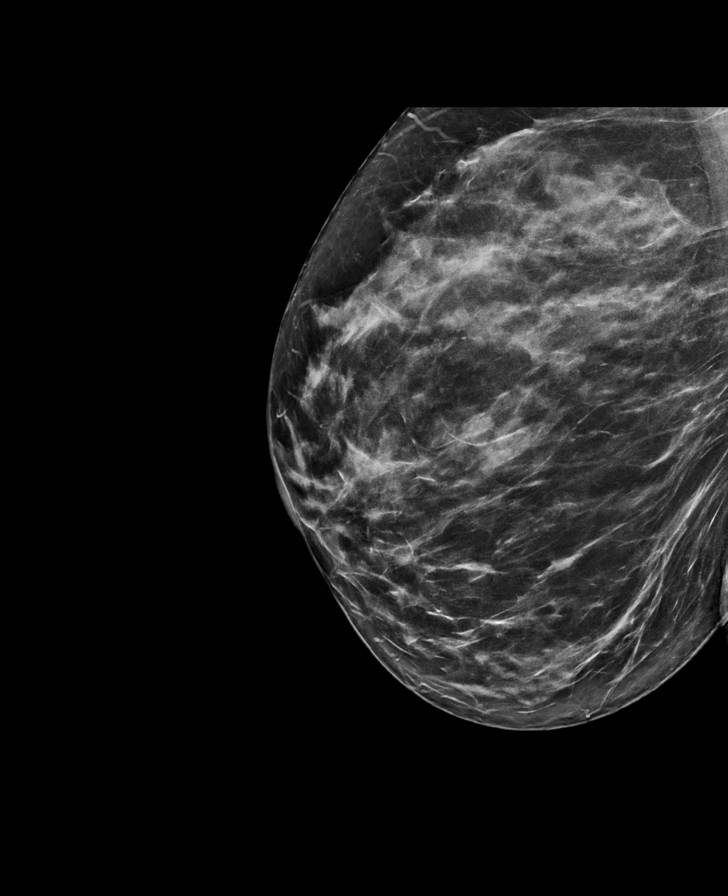

[L MLO synth-2D]
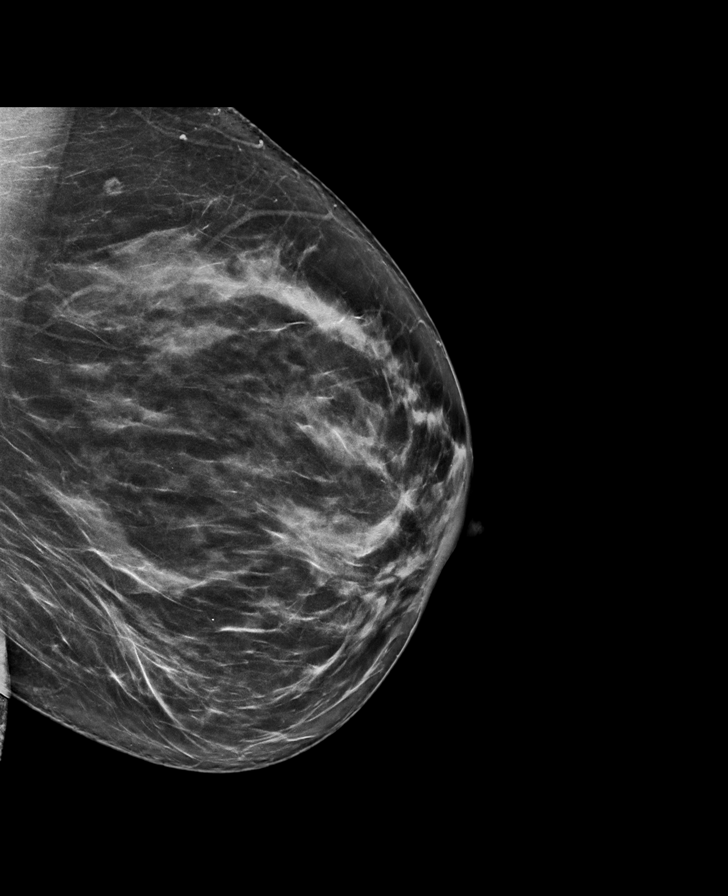

[L MLO tomo · tomo slice 43/84.0]
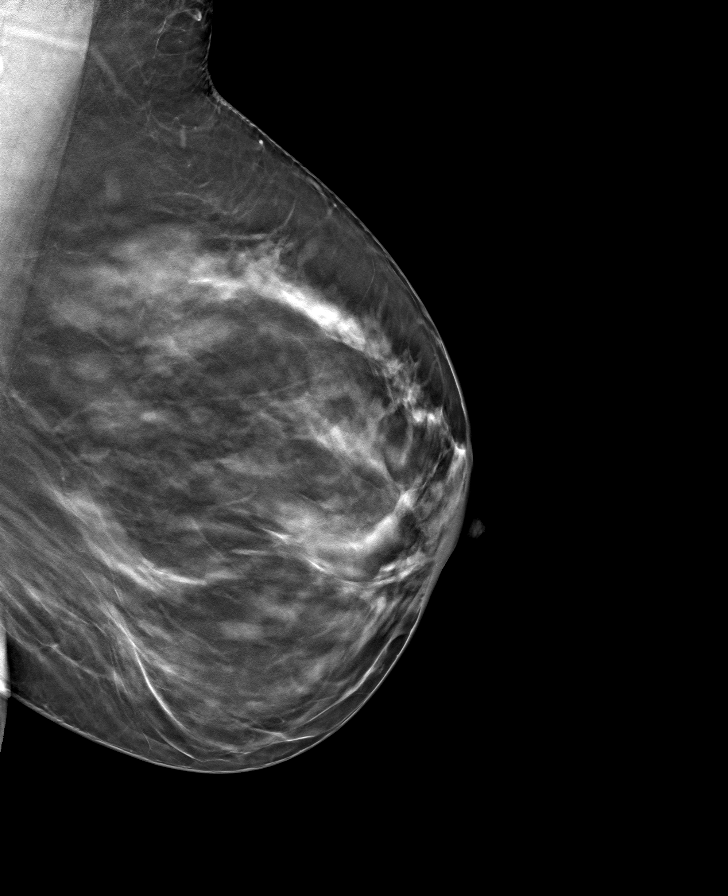

[R MLO tomo · tomo slice 43/84.0]
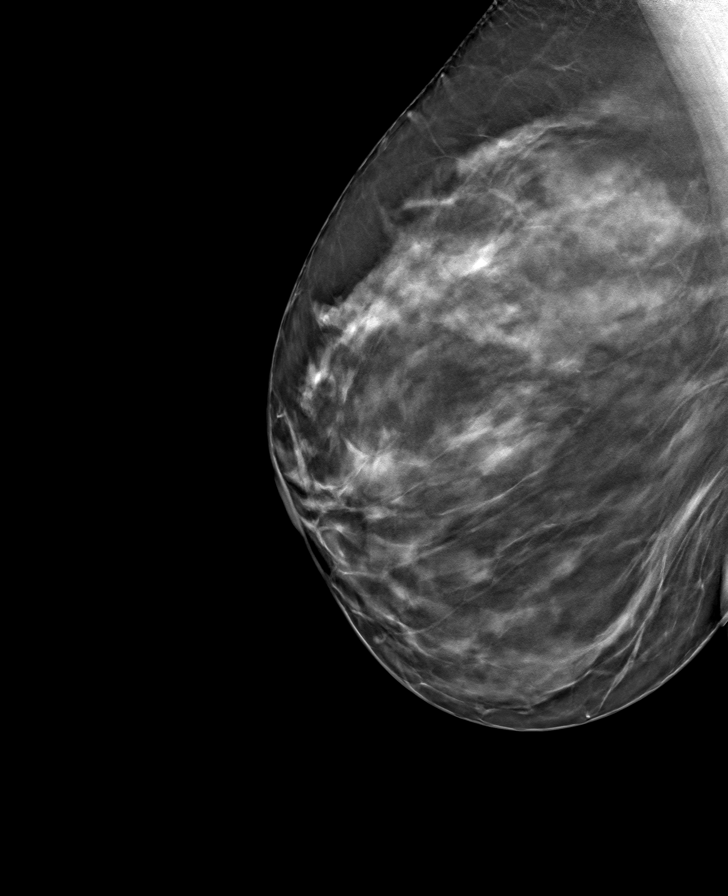

[L CC tomo · tomo slice 35/70.0]
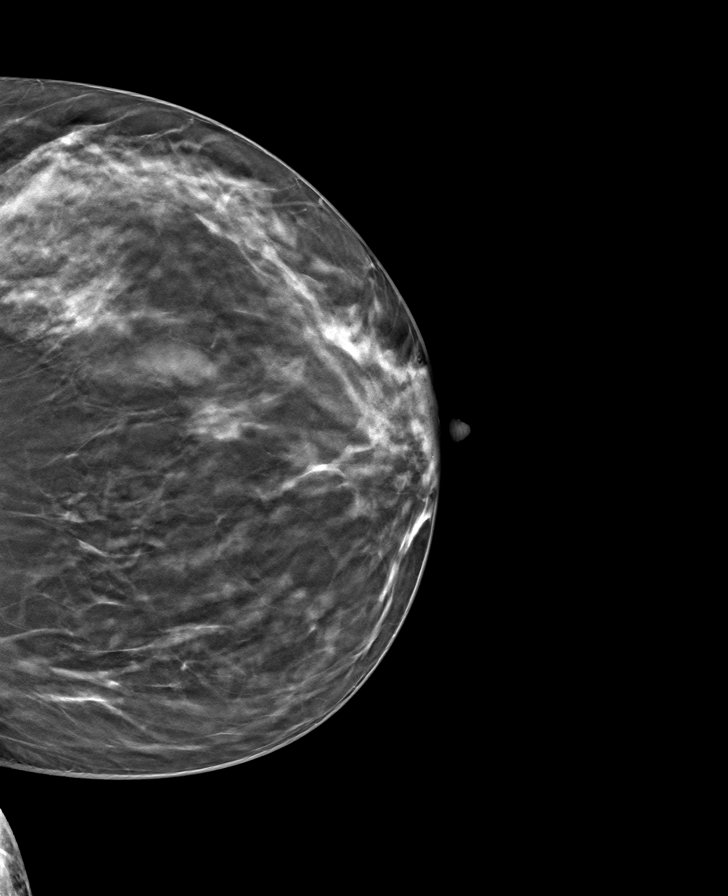

[R CC tomo · tomo slice 36/71.0]
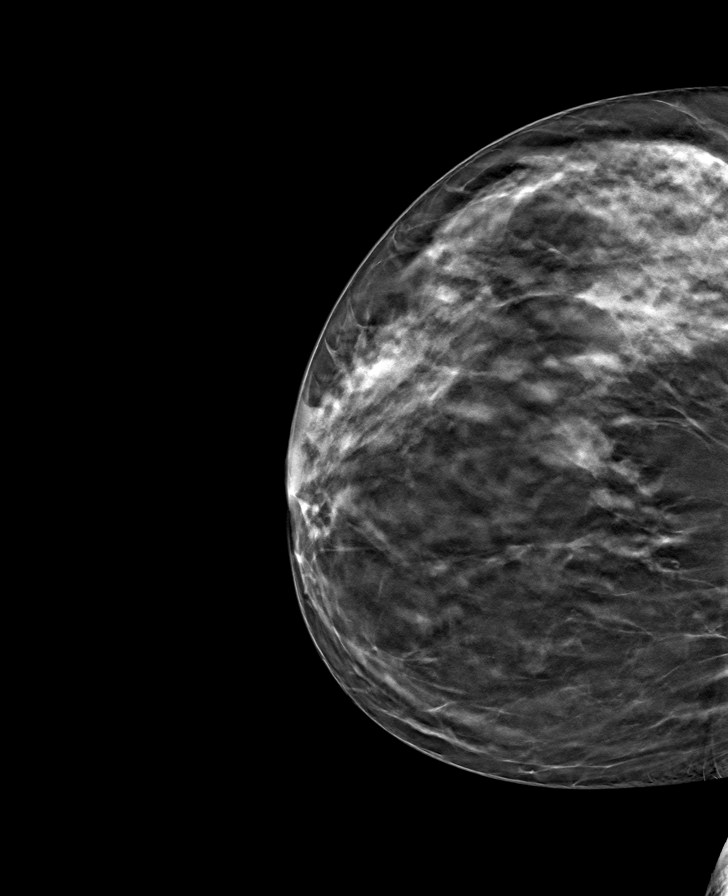

[8 of 24 positions shown; findings below may reference images not displayed]

ACR Breast Density Category c: The breast tissue is heterogeneously
dense, which may obscure small masses
FINDINGS: There are no findings suspicious for malignancy. Images were
processed with CAD.
IMPRESSION: No mammographic evidence of malignancy. A result letter of this
screening mammogram will be mailed directly to the patient.

RECOMMENDATION:
Screening mammogram in one year. (Code:EM-2-IHY)

BI-RADS CATEGORY  1: Negative.

## 2022-03-21 ENCOUNTER — Other Ambulatory Visit: Payer: Self-pay | Admitting: Nurse Practitioner

## 2022-03-21 DIAGNOSIS — Z1231 Encounter for screening mammogram for malignant neoplasm of breast: Secondary | ICD-10-CM

## 2022-04-01 ENCOUNTER — Ambulatory Visit
Admission: RE | Admit: 2022-04-01 | Discharge: 2022-04-01 | Disposition: A | Discharge status: Home / Self Care | Payer: 59 | Source: Ambulatory Visit | Attending: Nurse Practitioner | Admitting: Nurse Practitioner

## 2022-04-01 DIAGNOSIS — Z1231 Encounter for screening mammogram for malignant neoplasm of breast: Secondary | ICD-10-CM

## 2022-04-03 ENCOUNTER — Other Ambulatory Visit: Payer: Self-pay | Admitting: Nurse Practitioner

## 2022-04-03 DIAGNOSIS — R928 Other abnormal and inconclusive findings on diagnostic imaging of breast: Secondary | ICD-10-CM

## 2022-04-15 ENCOUNTER — Ambulatory Visit
Admission: RE | Admit: 2022-04-15 | Discharge: 2022-04-15 | Disposition: A | Discharge status: Home / Self Care | Payer: 59 | Source: Ambulatory Visit | Attending: Nurse Practitioner | Admitting: Nurse Practitioner

## 2022-04-15 ENCOUNTER — Ambulatory Visit: Payer: 59

## 2022-04-15 DIAGNOSIS — R928 Other abnormal and inconclusive findings on diagnostic imaging of breast: Secondary | ICD-10-CM

## 2023-06-23 ENCOUNTER — Other Ambulatory Visit: Payer: Self-pay | Admitting: Nurse Practitioner

## 2023-06-23 DIAGNOSIS — Z1231 Encounter for screening mammogram for malignant neoplasm of breast: Secondary | ICD-10-CM

## 2023-07-29 ENCOUNTER — Ambulatory Visit
Admission: RE | Admit: 2023-07-29 | Discharge: 2023-07-29 | Disposition: A | Discharge status: Home / Self Care | Payer: 59 | Source: Ambulatory Visit | Attending: Nurse Practitioner | Admitting: Nurse Practitioner

## 2023-07-29 DIAGNOSIS — Z1231 Encounter for screening mammogram for malignant neoplasm of breast: Secondary | ICD-10-CM
